# Patient Record
Sex: Male | Born: 1955 | Race: White | Marital: Married | Smoking: Never smoker
Health system: Southern US, Community
[De-identification: ages and names within clinical notes are randomized; demographics above are authoritative.]

## PROBLEM LIST (undated history)

## (undated) DIAGNOSIS — I10 Essential (primary) hypertension: Secondary | ICD-10-CM

## (undated) HISTORY — DX: Essential (primary) hypertension: I10

---

## 2016-07-24 DIAGNOSIS — K080 Exfoliation of teeth due to systemic causes: Secondary | ICD-10-CM | POA: Diagnosis not present

## 2016-12-21 DIAGNOSIS — H2513 Age-related nuclear cataract, bilateral: Secondary | ICD-10-CM | POA: Diagnosis not present

## 2016-12-21 DIAGNOSIS — H17823 Peripheral opacity of cornea, bilateral: Secondary | ICD-10-CM | POA: Diagnosis not present

## 2017-01-22 DIAGNOSIS — K080 Exfoliation of teeth due to systemic causes: Secondary | ICD-10-CM | POA: Diagnosis not present

## 2017-07-30 DIAGNOSIS — K080 Exfoliation of teeth due to systemic causes: Secondary | ICD-10-CM | POA: Diagnosis not present

## 2017-10-05 DIAGNOSIS — Z6831 Body mass index (BMI) 31.0-31.9, adult: Secondary | ICD-10-CM | POA: Diagnosis not present

## 2017-10-05 DIAGNOSIS — J209 Acute bronchitis, unspecified: Secondary | ICD-10-CM | POA: Diagnosis not present

## 2018-01-17 DIAGNOSIS — H2513 Age-related nuclear cataract, bilateral: Secondary | ICD-10-CM | POA: Diagnosis not present

## 2018-01-17 DIAGNOSIS — H17823 Peripheral opacity of cornea, bilateral: Secondary | ICD-10-CM | POA: Diagnosis not present

## 2018-08-12 DIAGNOSIS — M25562 Pain in left knee: Secondary | ICD-10-CM | POA: Diagnosis not present

## 2018-08-12 DIAGNOSIS — M1712 Unilateral primary osteoarthritis, left knee: Secondary | ICD-10-CM | POA: Diagnosis not present

## 2018-09-09 DIAGNOSIS — M238X2 Other internal derangements of left knee: Secondary | ICD-10-CM | POA: Diagnosis not present

## 2018-09-09 DIAGNOSIS — M25562 Pain in left knee: Secondary | ICD-10-CM | POA: Diagnosis not present

## 2018-09-12 ENCOUNTER — Other Ambulatory Visit: Payer: Self-pay | Admitting: Orthopedic Surgery

## 2018-09-12 DIAGNOSIS — R531 Weakness: Secondary | ICD-10-CM

## 2018-09-12 DIAGNOSIS — R52 Pain, unspecified: Secondary | ICD-10-CM

## 2018-09-18 ENCOUNTER — Ambulatory Visit
Admission: RE | Admit: 2018-09-18 | Discharge: 2018-09-18 | Disposition: A | Discharge status: Home / Self Care | Payer: Federal, State, Local not specified - PPO | Source: Ambulatory Visit | Attending: Orthopedic Surgery | Admitting: Orthopedic Surgery

## 2018-09-18 DIAGNOSIS — M25562 Pain in left knee: Secondary | ICD-10-CM | POA: Diagnosis not present

## 2018-09-18 DIAGNOSIS — R531 Weakness: Secondary | ICD-10-CM

## 2018-09-18 DIAGNOSIS — R52 Pain, unspecified: Secondary | ICD-10-CM

## 2018-09-19 DIAGNOSIS — S83412A Sprain of medial collateral ligament of left knee, initial encounter: Secondary | ICD-10-CM | POA: Diagnosis not present

## 2018-09-19 DIAGNOSIS — M6752 Plica syndrome, left knee: Secondary | ICD-10-CM | POA: Diagnosis not present

## 2018-09-19 DIAGNOSIS — S83242A Other tear of medial meniscus, current injury, left knee, initial encounter: Secondary | ICD-10-CM | POA: Diagnosis not present

## 2018-10-09 DIAGNOSIS — Y999 Unspecified external cause status: Secondary | ICD-10-CM | POA: Diagnosis not present

## 2018-10-09 DIAGNOSIS — S83232A Complex tear of medial meniscus, current injury, left knee, initial encounter: Secondary | ICD-10-CM | POA: Diagnosis not present

## 2018-10-09 DIAGNOSIS — M6752 Plica syndrome, left knee: Secondary | ICD-10-CM | POA: Diagnosis not present

## 2018-10-09 DIAGNOSIS — M659 Synovitis and tenosynovitis, unspecified: Secondary | ICD-10-CM | POA: Diagnosis not present

## 2018-10-09 DIAGNOSIS — G8918 Other acute postprocedural pain: Secondary | ICD-10-CM | POA: Diagnosis not present

## 2018-10-09 DIAGNOSIS — X58XXXA Exposure to other specified factors, initial encounter: Secondary | ICD-10-CM | POA: Diagnosis not present

## 2018-10-09 DIAGNOSIS — M94262 Chondromalacia, left knee: Secondary | ICD-10-CM | POA: Diagnosis not present

## 2018-10-15 DIAGNOSIS — M25562 Pain in left knee: Secondary | ICD-10-CM | POA: Diagnosis not present

## 2018-10-15 DIAGNOSIS — R262 Difficulty in walking, not elsewhere classified: Secondary | ICD-10-CM | POA: Diagnosis not present

## 2018-10-15 DIAGNOSIS — M6752 Plica syndrome, left knee: Secondary | ICD-10-CM | POA: Diagnosis not present

## 2018-10-15 DIAGNOSIS — S83232A Complex tear of medial meniscus, current injury, left knee, initial encounter: Secondary | ICD-10-CM | POA: Diagnosis not present

## 2019-01-20 IMAGING — MR MR KNEE*L* W/O CM
7 series · 38 of 40 positions shown · non-contrast
Comparison: None.

CLINICAL DATA: Medial left knee pain started 3 months ago.

EXAM:
MRI OF THE LEFT KNEE WITHOUT CONTRAST
TECHNIQUE: Multiplanar, multisequence MR imaging of the knee was performed. No
intravenous contrast was administered.

[Series 5: T2 fat-sat · axial · left · 4.0mm · 0.50mm/px · z∈[-62,+91]mm · 8 of 36 slices shown (1 of 3)]
[im 1/36]
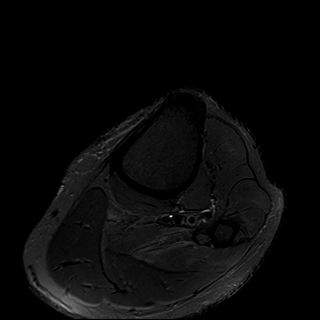
[im 6/36]
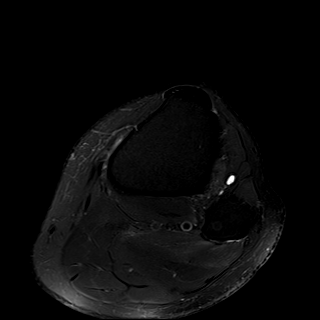
[im 11/36]
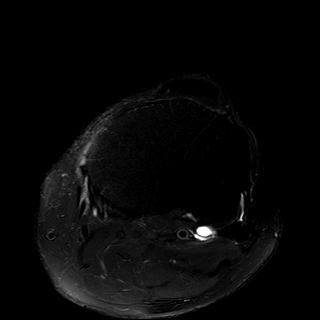
[im 16/36]
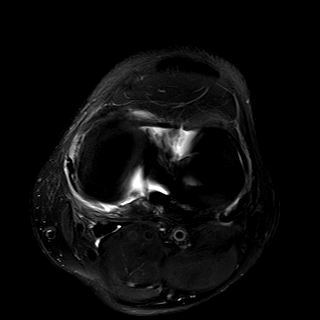
[im 21/36]
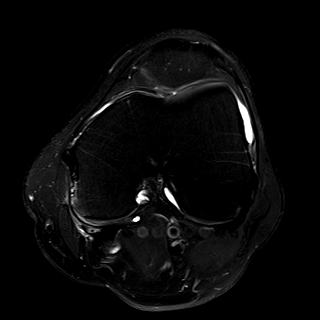
[im 26/36]
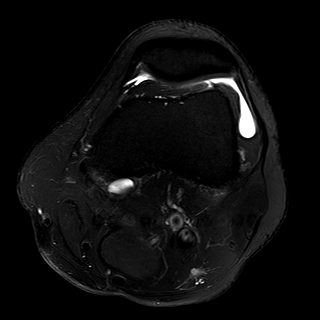
[im 31/36]
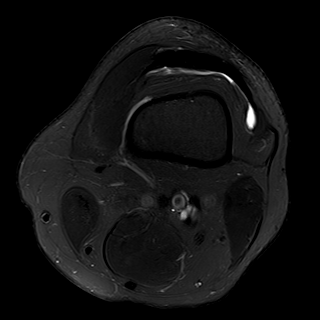
[im 36/36]
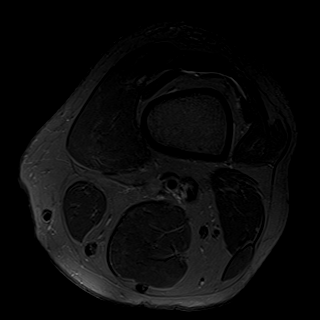

[Series 6: T2 fat-sat · coronal · left · 4.0mm · 0.39mm/px · 5 of 28 slices shown (2 of 3)]
[im 1/28]
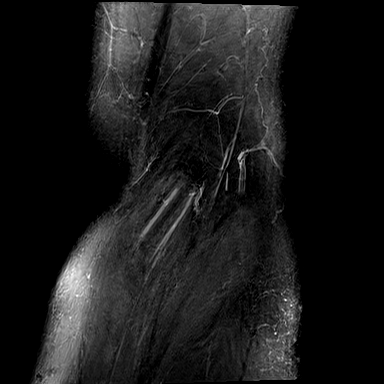
[im 7/28]
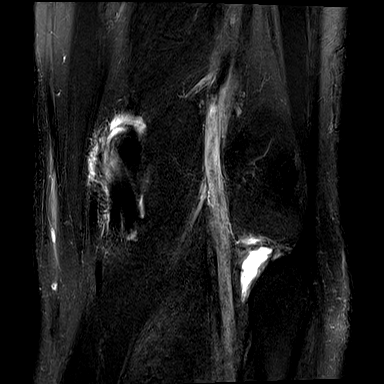
[im 14/28]
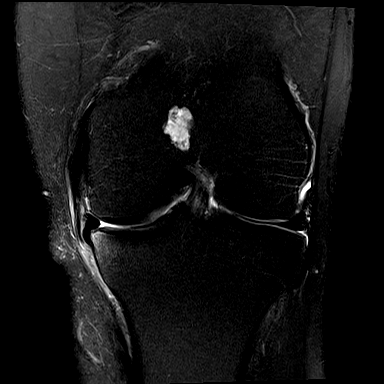
[im 21/28]
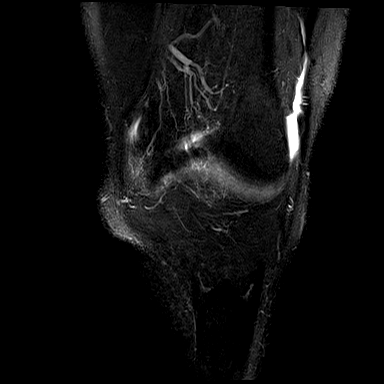
[im 28/28]
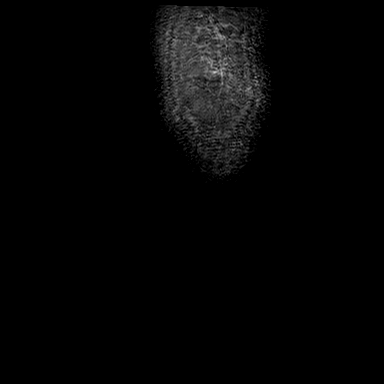

[Series 7: T1 · coronal · left · 4.0mm · 0.39mm/px · 3 of 28 slices shown]
[im 1/28]
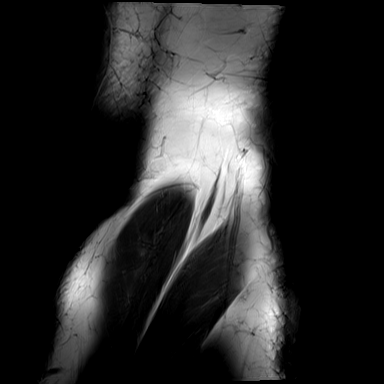
[im 7/28]
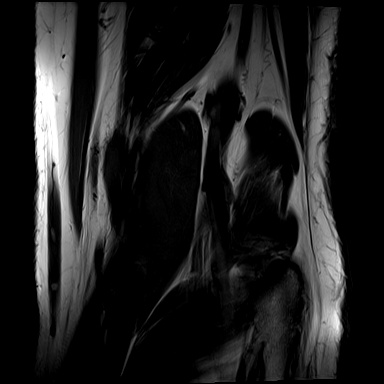
[im 14/28]
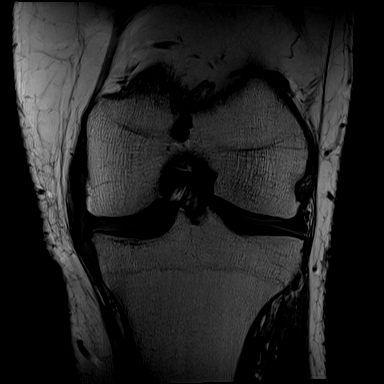

[Series 8: PD fat-sat · coronal · left · 3.0mm · 0.47mm/px · 6 of 32 slices shown (1 of 2)]
[im 1/32]
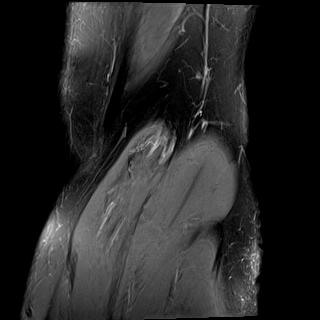
[im 7/32]
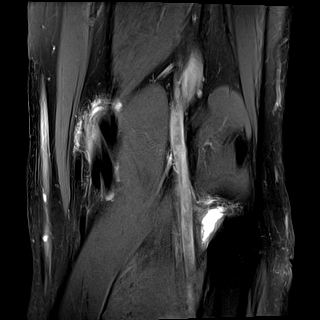
[im 13/32]
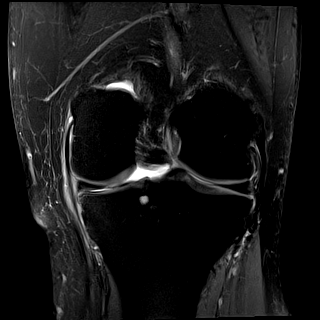
[im 19/32]
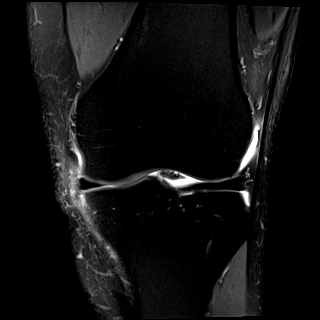
[im 25/32]
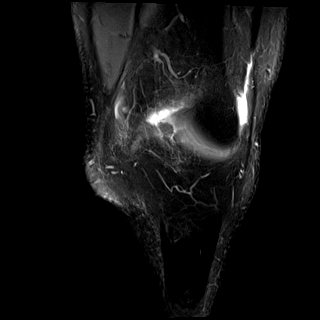
[im 32/32]
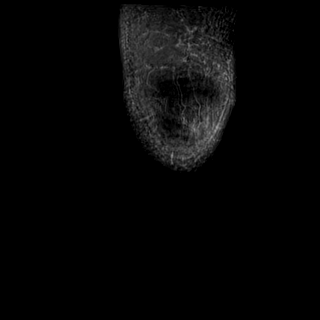

[Series 9: PD fat-sat · sagittal · left · 3.0mm · 0.39mm/px · 6 of 30 slices shown (2 of 2)]
[im 1/30]
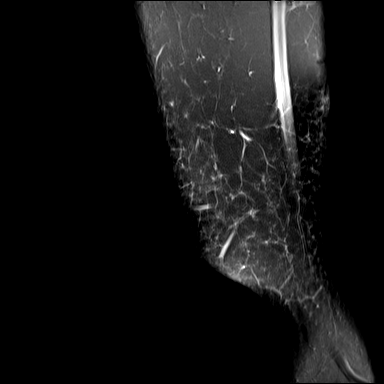
[im 6/30]
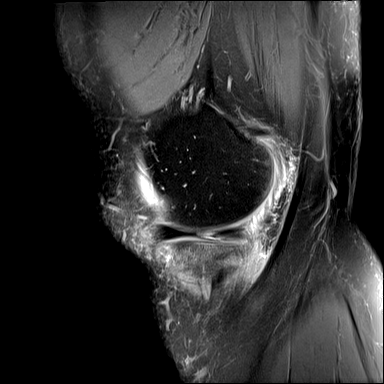
[im 12/30]
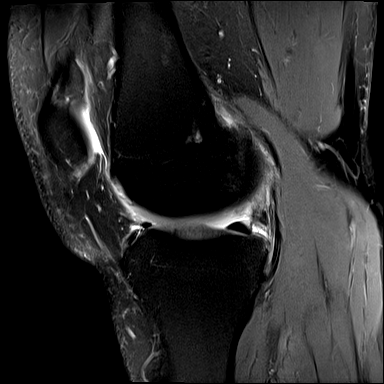
[im 18/30]
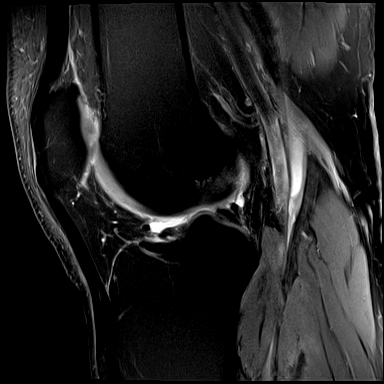
[im 24/30]
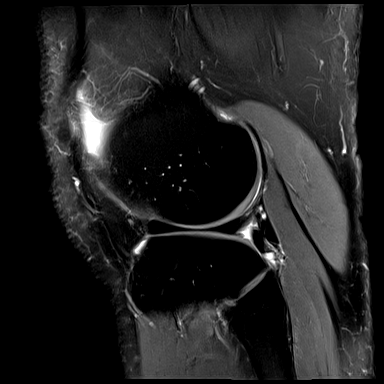
[im 30/30]
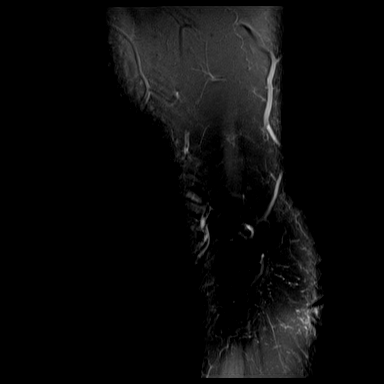

[Series 10: T2 fat-sat · sagittal · left · 3.0mm · 0.39mm/px · 6 of 30 slices shown (3 of 3)]
[im 1/30]
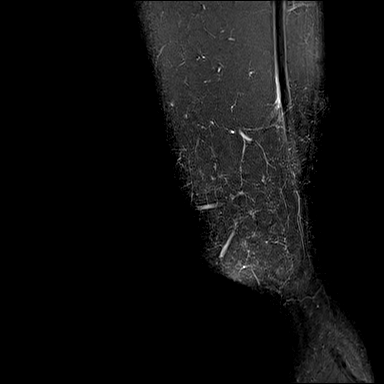
[im 6/30]
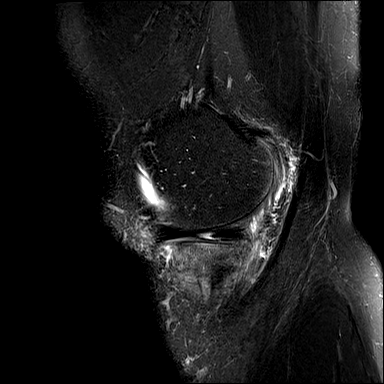
[im 12/30]
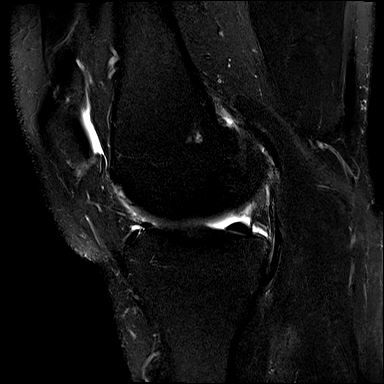
[im 18/30]
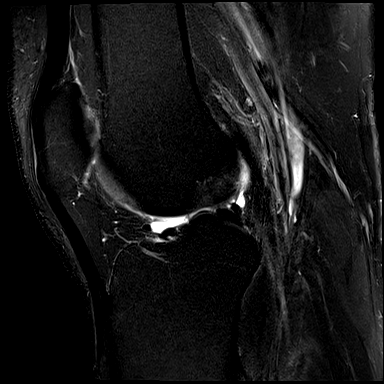
[im 24/30]
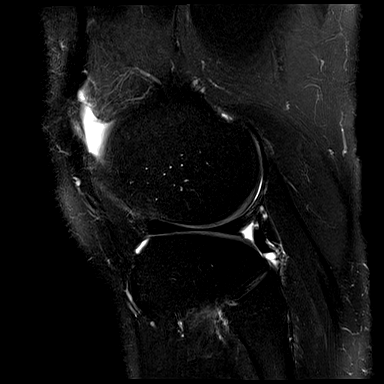
[im 30/30]
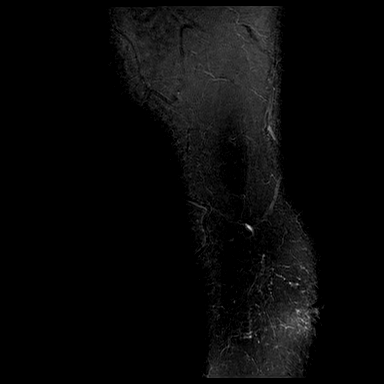

[Series 11: PD · oblique · left · 1.5mm · 0.44mm/px · 4 of 21 slices shown]
[im 1/21]
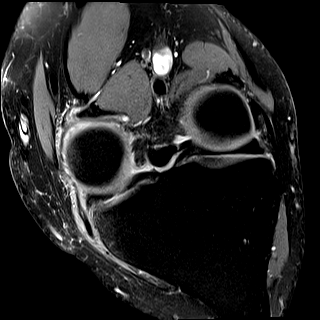
[im 7/21]
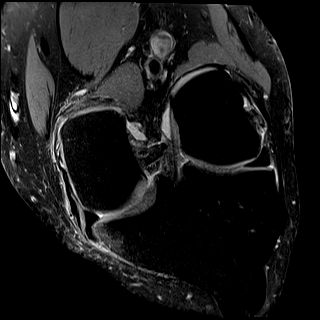
[im 14/21]
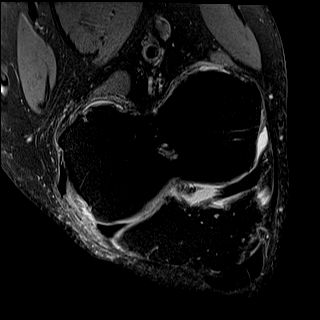
[im 21/21]
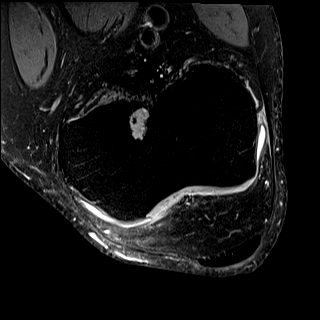

[38 of 40 positions shown; findings below may reference images not displayed]

FINDINGS: MENISCI

Medial meniscus: Flap tear of the midbody and adjacent posterior
horn with the meniscal flap extending below the midbody into the
medial gutter on image [DATE].

Lateral meniscus:  Discoid variant.  No tear observed.

LIGAMENTS

Cruciates:  Unremarkable

Collaterals: The MCL is abnormally thickened and with mild
surrounding edema compatible with grade 2 sprain or partial tear.

CARTILAGE

Patellofemoral: Partial-thickness chondral defect along the
posterior patellar ridge. Moderate chondral thinning with chondral
heterogeneity and chondral irregularity medially in the femoral
trochlear groove.

Medial:  Mild degenerative chondral thinning.

Lateral:  Unremarkable

Joint:  Small knee effusion.  Thin medial plica.

Popliteal Fossa:  Small Baker's cyst.  Trace pes anserine bursitis.

Extensor Mechanism:  Unremarkable

Bones: 1.7 by 0.9 cm chondroid lesion in the distal femoral
metaphysis. Small degenerative subcortical cyst or geode posteriorly
along the tibial spine.

Other: No supplemental non-categorized findings.
IMPRESSION: 1. Flap tear of the midbody and adjacent posterior horn medial
meniscus with the flap extending into the medial gutter.
2. Grade 2 sprain or partial tear of the MCL.
3. Partial thickness chondral defects with chondral heterogeneity
and irregularity in the medial femoral trochlear groove. Mild
partial-thickness chondral defect along the posterior patellar
ridge.
4. Small knee effusion with small Baker's cyst.  Thin medial plica.
5. Trace pes anserine bursitis.
6. Small enchondroma of the distal femoral metaphysis.

## 2022-08-15 ENCOUNTER — Other Ambulatory Visit: Payer: Self-pay

## 2022-08-15 ENCOUNTER — Emergency Department
Admission: EM | Admit: 2022-08-15 | Discharge: 2022-08-15 | Disposition: A | Discharge status: Home / Self Care | Payer: Medicare Other | Attending: Emergency Medicine | Admitting: Emergency Medicine

## 2022-08-15 DIAGNOSIS — Z203 Contact with and (suspected) exposure to rabies: Secondary | ICD-10-CM | POA: Diagnosis not present

## 2022-08-15 DIAGNOSIS — W540XXA Bitten by dog, initial encounter: Secondary | ICD-10-CM

## 2022-08-15 DIAGNOSIS — Z23 Encounter for immunization: Secondary | ICD-10-CM | POA: Diagnosis not present

## 2022-08-15 HISTORY — DX: Essential (primary) hypertension: I10

## 2022-08-15 MED ORDER — RABIES VACCINE, PCEC IM SUSR
1.0000 mL | Freq: Once | INTRAMUSCULAR | Status: AC
  Administered 2022-08-15: 1 mL via INTRAMUSCULAR
  Filled 2022-08-15: qty 1

## 2022-08-15 MED ORDER — RABIES IMMUNE GLOBULIN 150 UNIT/ML IM INJ
20.0000 [IU]/kg | INJECTION | Freq: Once | INTRAMUSCULAR | Status: AC
  Administered 2022-08-15: 2100 [IU]
  Filled 2022-08-15: qty 20

## 2022-08-15 NOTE — ED Provider Triage Note (Signed)
  Emergency Medicine Provider Triage Evaluation Note  Duane Taylor , a 66 y.o.male,  was evaluated in triage.  Pt complains of dog bite.  He states that he had an encounter with a neighbors dog, which bit his right leg.  He states that it is superficial, however he was advised by his physician to get rabies vaccinations.  Dog is not up-to-date on vaccinations, though is under quarantine at this time.   Review of Systems  Positive: Dog bite Negative: Denies fever, chest pain, vomiting  Physical Exam  There were no vitals filed for this visit. Gen:   Awake, no distress   Resp:  Normal effort  MSK:   Moves extremities without difficulty  Other:  Small, 1 mm superficial puncture wound to the right lower extremity.  Medical Decision Making  Given the patient's initial medical screening exam, the following diagnostic evaluation has been ordered. The patient will be placed in the appropriate treatment space, once one is available, to complete the evaluation and treatment. I have discussed the plan of care with the patient and I have advised the patient that an ED physician or mid-level practitioner will reevaluate their condition after the test results have been received, as the results may give them additional insight into the type of treatment they may need.    Diagnostics: None immediately.  Treatments: none immediately   Varney Daily, [place] 08/15/22 1335

## 2022-08-15 NOTE — Discharge Instructions (Addendum)
Subsequent rabies shots can be gotten at the [facility] health urgent care in [place] address is [phone] we [address] open Monday through Friday 80 and 4 with online scheduling a walk-in he can also get them at the [facility] health urgent care at [facility] which is at [address] [place] open 8-8 Monday through Friday and Saturday and Sunday 80 and 4 PM with online scheduling a walk-in.  Both urgent cares specifically provide follow-up 2 rabies vaccines he do not need to call them.  He do not have to return here for the rest of the vaccine series  The vaccine is usually given on days 1, 3, 7 and 14.

## 2022-08-15 NOTE — ED Triage Notes (Signed)
Patient was bitten by neighboorhood unvaccinated dog which is now in quarantine and his PCP recommends him getting the rabies series.

## 2022-08-15 NOTE — ED Provider Notes (Signed)
   [facility] Provider Note    Event Date/Time   First MD Initiated Contact with Patient 08/15/22 1516     (approximate)   History   Animal Bite   HPI  Duane Taylor is a 66 y.o. male who reports he was walking down the street and when his neighbors dog ran out out of the house into the street and bit him on the leg.  This is an unprovoked bite.  Patient went to see his doctor who told him to get the rabies shot.  He subsequently came here.  The dog has been quarantined and the state is aware.      Physical Exam   Triage Vital Signs: ED Triage Vitals  Enc Vitals Group     BP 08/15/22 1336 129/88     Pulse Rate 08/15/22 1336 72     Resp 08/15/22 1336 18     Temp 08/15/22 1336 98 F (36.7 C)     Temp Source 08/15/22 1336 Oral     SpO2 08/15/22 1336 99 %     Weight 08/15/22 1333 235 lb (106.6 kg)     Height 08/15/22 1333 6\' 2"  (1.88 m)     Head Circumference --      Peak Flow --      Pain Score 08/15/22 1333 0     Pain Loc --      Pain Edu? --      Excl. in GC? --     Most recent vital signs: Vitals:   08/15/22 1336  BP: 129/88  Pulse: 72  Resp: 18  Temp: 98 F (36.7 C)  SpO2: 99%     General: Awake, no distress.  CV:  Good peripheral perfusion.  Heart regular rate and rhythm no audible murmurs Resp:  Normal effort.  Lungs are clear Extremities patient with skin penetration and 4 spots on the right leg with very superficial.  Patient reports he washed it very well.   ED Results / Procedures / Treatments   Labs (all labs ordered are listed, but only abnormal results are displayed) Labs Reviewed - No data to display   EKG     RADIOLOGY   PROCEDURES:  Critical Care performed:   Procedures   MEDICATIONS ORDERED IN ED: Medications  rabies immune globulin (HYPERRAB/KEDRAB) injection 2,100 Units (2,100 Units Infiltration Given 08/15/22 1609)  rabies vaccine (RABAVERT) injection 1 mL (1 mL Intramuscular Given 08/15/22  1609)     IMPRESSION / MDM / ASSESSMENT AND PLAN / ED COURSE  I reviewed the triage vital signs and the nursing notes. Discussed with patient that since the dog is in quarantine most of the time we just watch the patient and if nothing happens in 10 days he is fine but the patient says his doctor told him to get the rabies shot and he really wants to get the rabies shots so I have ordered the rabies shot for him.          FINAL CLINICAL IMPRESSION(S) / ED DIAGNOSES   Final diagnoses:  Dog bite, initial encounter     Rx / DC Orders   ED Discharge Orders     None        Note:  This document was prepared using Dragon voice recognition software and may include unintentional dictation errors.   14/12/23, MD 08/15/22 2023

## 2022-08-18 ENCOUNTER — Ambulatory Visit
Admission: RE | Admit: 2022-08-18 | Discharge: 2022-08-18 | Disposition: A | Discharge status: Home / Self Care | Payer: Federal, State, Local not specified - PPO | Source: Ambulatory Visit | Attending: Emergency Medicine | Admitting: Emergency Medicine

## 2022-08-18 DIAGNOSIS — Z203 Contact with and (suspected) exposure to rabies: Secondary | ICD-10-CM | POA: Diagnosis not present

## 2022-08-18 MED ORDER — RABIES VACCINE, PCEC IM SUSR
1.0000 mL | Freq: Once | INTRAMUSCULAR | Status: AC
  Administered 2022-08-18: 1 mL via INTRAMUSCULAR

## 2022-08-18 NOTE — ED Triage Notes (Signed)
Patient to Urgent Care for second rabies vaccine.   Patient received first dose 12/12. Patient was bitten by a neighborhood unvaccinated dog.

## 2022-08-18 NOTE — ED Notes (Signed)
Patient tolerated rabies vaccine with no issue. Advised to come back for additional doses. Patient verbalized understanding.

## 2022-08-22 ENCOUNTER — Ambulatory Visit
Admission: RE | Admit: 2022-08-22 | Discharge: 2022-08-22 | Disposition: A | Discharge status: Home / Self Care | Payer: Federal, State, Local not specified - PPO | Source: Ambulatory Visit | Attending: Internal Medicine | Admitting: Internal Medicine

## 2022-08-22 DIAGNOSIS — Z203 Contact with and (suspected) exposure to rabies: Secondary | ICD-10-CM

## 2022-08-22 MED ORDER — RABIES VACCINE, PCEC IM SUSR
1.0000 mL | Freq: Once | INTRAMUSCULAR | Status: AC
  Administered 2022-08-22: 1 mL via INTRAMUSCULAR

## 2022-08-22 NOTE — ED Notes (Addendum)
Patient to urgent care for third dose (day 7) of rabies vaccine.  Pt. Received second dose on 08/18/2022

## 2022-08-22 NOTE — ED Notes (Addendum)
Pt. Tolerated rabbies vaccine well. Pt. Informed to return for his additional dose(s). Pt. Refused AVS and stated he already had one with follow-up dates. I restated the importance of the AVS and follow-up dates. Pt. Verbalized understanding and still refused follow-up AVS.

## 2022-08-29 ENCOUNTER — Ambulatory Visit
Admission: RE | Admit: 2022-08-29 | Discharge: 2022-08-29 | Disposition: A | Discharge status: Home / Self Care | Payer: Federal, State, Local not specified - PPO | Source: Ambulatory Visit | Attending: Emergency Medicine | Admitting: Emergency Medicine

## 2022-08-29 DIAGNOSIS — Z203 Contact with and (suspected) exposure to rabies: Secondary | ICD-10-CM

## 2022-08-29 MED ORDER — RABIES VACCINE, PCEC IM SUSR
1.0000 mL | Freq: Once | INTRAMUSCULAR | Status: AC
  Administered 2022-08-29: 1 mL via INTRAMUSCULAR

## 2022-08-29 NOTE — ED Triage Notes (Signed)
Patient presents to Urgent Care to for fourth rabies vaccine.   Received third dose 12/19.

## 2022-08-29 NOTE — ED Notes (Addendum)
Rabies vaccine administered into left deltoid with no issue. No further questions asked.
